# Patient Record
Sex: Male | Born: 2000
Health system: Southern US, Community
[De-identification: ages and names within clinical notes are randomized; demographics above are authoritative.]

---
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
## 2023-10-10 ENCOUNTER — Other Ambulatory Visit (HOSPITAL_COMMUNITY): Payer: Self-pay

## 2023-10-11 ENCOUNTER — Other Ambulatory Visit (HOSPITAL_COMMUNITY): Payer: Self-pay

## 2023-10-11 MED ORDER — QELBREE 200 MG PO CP24
200.0000 mg | ORAL_CAPSULE | Freq: Every day | ORAL | 0 refills | Status: AC
  Filled 2023-10-11 (×2): qty 30, 30d supply, fill #0

## 2023-10-15 ENCOUNTER — Other Ambulatory Visit (HOSPITAL_COMMUNITY): Payer: Self-pay
